# Patient Record
Sex: Female | Born: 1945 | Race: White | Hispanic: No | Marital: Married | Smoking: Never smoker
Health system: Southern US, Community
[De-identification: ages and names within clinical notes are randomized; demographics above are authoritative.]

---

## 2005-01-18 ENCOUNTER — Ambulatory Visit: Payer: Self-pay | Admitting: Obstetrics and Gynecology

## 2006-12-20 ENCOUNTER — Ambulatory Visit: Payer: Self-pay | Admitting: Obstetrics and Gynecology

## 2007-01-11 ENCOUNTER — Ambulatory Visit: Payer: Self-pay | Admitting: Obstetrics and Gynecology

## 2008-02-20 ENCOUNTER — Ambulatory Visit: Payer: Self-pay | Admitting: Obstetrics and Gynecology

## 2008-02-27 ENCOUNTER — Ambulatory Visit: Payer: Self-pay | Admitting: Obstetrics and Gynecology

## 2008-03-27 ENCOUNTER — Ambulatory Visit: Payer: Self-pay | Admitting: Internal Medicine

## 2009-02-24 ENCOUNTER — Ambulatory Visit: Payer: Self-pay | Admitting: Obstetrics and Gynecology

## 2010-02-17 ENCOUNTER — Ambulatory Visit: Payer: Self-pay

## 2011-02-22 ENCOUNTER — Ambulatory Visit: Payer: Self-pay

## 2012-03-08 ENCOUNTER — Ambulatory Visit: Payer: Self-pay

## 2013-03-27 ENCOUNTER — Ambulatory Visit: Payer: Self-pay

## 2013-04-08 ENCOUNTER — Ambulatory Visit: Payer: Self-pay

## 2016-04-20 DIAGNOSIS — K219 Gastro-esophageal reflux disease without esophagitis: Secondary | ICD-10-CM | POA: Diagnosis not present

## 2016-04-20 DIAGNOSIS — Z1239 Encounter for other screening for malignant neoplasm of breast: Secondary | ICD-10-CM | POA: Diagnosis not present

## 2016-04-20 DIAGNOSIS — J329 Chronic sinusitis, unspecified: Secondary | ICD-10-CM | POA: Diagnosis not present

## 2016-04-20 DIAGNOSIS — Z0001 Encounter for general adult medical examination with abnormal findings: Secondary | ICD-10-CM | POA: Diagnosis not present

## 2016-04-20 DIAGNOSIS — M810 Age-related osteoporosis without current pathological fracture: Secondary | ICD-10-CM | POA: Diagnosis not present

## 2016-04-20 DIAGNOSIS — R3129 Other microscopic hematuria: Secondary | ICD-10-CM | POA: Diagnosis not present

## 2016-09-21 DIAGNOSIS — H40019 Open angle with borderline findings, low risk, unspecified eye: Secondary | ICD-10-CM | POA: Diagnosis not present

## 2016-09-21 DIAGNOSIS — H353131 Nonexudative age-related macular degeneration, bilateral, early dry stage: Secondary | ICD-10-CM | POA: Diagnosis not present

## 2016-09-21 DIAGNOSIS — H401130 Primary open-angle glaucoma, bilateral, stage unspecified: Secondary | ICD-10-CM | POA: Diagnosis not present

## 2016-09-21 DIAGNOSIS — H04129 Dry eye syndrome of unspecified lacrimal gland: Secondary | ICD-10-CM | POA: Diagnosis not present

## 2017-01-25 DIAGNOSIS — D239 Other benign neoplasm of skin, unspecified: Secondary | ICD-10-CM | POA: Diagnosis not present

## 2017-01-25 DIAGNOSIS — L439 Lichen planus, unspecified: Secondary | ICD-10-CM | POA: Diagnosis not present

## 2017-01-25 DIAGNOSIS — L608 Other nail disorders: Secondary | ICD-10-CM | POA: Diagnosis not present

## 2017-03-20 DIAGNOSIS — H401130 Primary open-angle glaucoma, bilateral, stage unspecified: Secondary | ICD-10-CM | POA: Diagnosis not present

## 2017-03-20 DIAGNOSIS — H04129 Dry eye syndrome of unspecified lacrimal gland: Secondary | ICD-10-CM | POA: Diagnosis not present

## 2017-03-20 DIAGNOSIS — H353131 Nonexudative age-related macular degeneration, bilateral, early dry stage: Secondary | ICD-10-CM | POA: Diagnosis not present

## 2017-04-25 ENCOUNTER — Other Ambulatory Visit: Payer: Self-pay | Admitting: Internal Medicine

## 2017-04-25 DIAGNOSIS — Z0000 Encounter for general adult medical examination without abnormal findings: Secondary | ICD-10-CM

## 2017-04-25 DIAGNOSIS — Z780 Asymptomatic menopausal state: Secondary | ICD-10-CM | POA: Diagnosis not present

## 2017-04-25 DIAGNOSIS — K219 Gastro-esophageal reflux disease without esophagitis: Secondary | ICD-10-CM | POA: Diagnosis not present

## 2017-04-25 DIAGNOSIS — R3129 Other microscopic hematuria: Secondary | ICD-10-CM | POA: Diagnosis not present

## 2017-04-25 DIAGNOSIS — R1013 Epigastric pain: Secondary | ICD-10-CM | POA: Diagnosis not present

## 2017-04-25 DIAGNOSIS — M810 Age-related osteoporosis without current pathological fracture: Secondary | ICD-10-CM | POA: Diagnosis not present

## 2017-05-01 DIAGNOSIS — R3129 Other microscopic hematuria: Secondary | ICD-10-CM | POA: Diagnosis not present

## 2017-05-01 DIAGNOSIS — Z0000 Encounter for general adult medical examination without abnormal findings: Secondary | ICD-10-CM | POA: Diagnosis not present

## 2017-05-01 DIAGNOSIS — M810 Age-related osteoporosis without current pathological fracture: Secondary | ICD-10-CM | POA: Diagnosis not present

## 2017-05-10 ENCOUNTER — Other Ambulatory Visit: Payer: Self-pay

## 2017-05-11 ENCOUNTER — Ambulatory Visit
Admission: RE | Admit: 2017-05-11 | Discharge: 2017-05-11 | Disposition: A | Discharge status: Home / Self Care | Payer: BLUE CROSS/BLUE SHIELD | Source: Ambulatory Visit | Attending: Internal Medicine | Admitting: Internal Medicine

## 2017-05-11 DIAGNOSIS — Z780 Asymptomatic menopausal state: Secondary | ICD-10-CM | POA: Diagnosis not present

## 2017-05-11 DIAGNOSIS — M810 Age-related osteoporosis without current pathological fracture: Secondary | ICD-10-CM | POA: Insufficient documentation

## 2017-05-11 DIAGNOSIS — Z0000 Encounter for general adult medical examination without abnormal findings: Secondary | ICD-10-CM

## 2018-04-30 ENCOUNTER — Other Ambulatory Visit: Payer: Self-pay | Admitting: Internal Medicine

## 2018-04-30 DIAGNOSIS — Z1231 Encounter for screening mammogram for malignant neoplasm of breast: Secondary | ICD-10-CM

## 2018-06-01 ENCOUNTER — Ambulatory Visit
Admission: RE | Admit: 2018-06-01 | Discharge: 2018-06-01 | Disposition: A | Discharge status: Home / Self Care | Payer: Medicare HMO | Source: Ambulatory Visit | Attending: Internal Medicine | Admitting: Internal Medicine

## 2018-06-01 DIAGNOSIS — Z1231 Encounter for screening mammogram for malignant neoplasm of breast: Secondary | ICD-10-CM | POA: Diagnosis not present

## 2018-10-31 DIAGNOSIS — M79605 Pain in left leg: Secondary | ICD-10-CM | POA: Diagnosis not present

## 2018-12-17 DIAGNOSIS — H02889 Meibomian gland dysfunction of unspecified eye, unspecified eyelid: Secondary | ICD-10-CM | POA: Diagnosis not present

## 2018-12-17 DIAGNOSIS — H04129 Dry eye syndrome of unspecified lacrimal gland: Secondary | ICD-10-CM | POA: Diagnosis not present

## 2018-12-17 DIAGNOSIS — H401131 Primary open-angle glaucoma, bilateral, mild stage: Secondary | ICD-10-CM | POA: Diagnosis not present

## 2018-12-17 DIAGNOSIS — H40019 Open angle with borderline findings, low risk, unspecified eye: Secondary | ICD-10-CM | POA: Diagnosis not present

## 2018-12-17 DIAGNOSIS — H353131 Nonexudative age-related macular degeneration, bilateral, early dry stage: Secondary | ICD-10-CM | POA: Diagnosis not present

## 2018-12-18 DIAGNOSIS — L439 Lichen planus, unspecified: Secondary | ICD-10-CM | POA: Diagnosis not present

## 2018-12-18 DIAGNOSIS — L608 Other nail disorders: Secondary | ICD-10-CM | POA: Diagnosis not present

## 2019-05-28 DIAGNOSIS — R3129 Other microscopic hematuria: Secondary | ICD-10-CM | POA: Diagnosis not present

## 2019-05-28 DIAGNOSIS — Z0000 Encounter for general adult medical examination without abnormal findings: Secondary | ICD-10-CM | POA: Diagnosis not present

## 2019-05-28 DIAGNOSIS — M810 Age-related osteoporosis without current pathological fracture: Secondary | ICD-10-CM | POA: Diagnosis not present

## 2019-05-28 DIAGNOSIS — E785 Hyperlipidemia, unspecified: Secondary | ICD-10-CM | POA: Diagnosis not present

## 2019-06-04 DIAGNOSIS — M810 Age-related osteoporosis without current pathological fracture: Secondary | ICD-10-CM | POA: Diagnosis not present

## 2019-06-04 DIAGNOSIS — K219 Gastro-esophageal reflux disease without esophagitis: Secondary | ICD-10-CM | POA: Diagnosis not present

## 2019-06-04 DIAGNOSIS — J329 Chronic sinusitis, unspecified: Secondary | ICD-10-CM | POA: Diagnosis not present

## 2019-06-04 DIAGNOSIS — R3129 Other microscopic hematuria: Secondary | ICD-10-CM | POA: Diagnosis not present

## 2019-06-04 DIAGNOSIS — Z0000 Encounter for general adult medical examination without abnormal findings: Secondary | ICD-10-CM | POA: Diagnosis not present

## 2019-06-04 DIAGNOSIS — E559 Vitamin D deficiency, unspecified: Secondary | ICD-10-CM | POA: Diagnosis not present

## 2019-12-16 DIAGNOSIS — H04129 Dry eye syndrome of unspecified lacrimal gland: Secondary | ICD-10-CM | POA: Diagnosis not present

## 2019-12-16 DIAGNOSIS — H40013 Open angle with borderline findings, low risk, bilateral: Secondary | ICD-10-CM | POA: Diagnosis not present

## 2019-12-16 DIAGNOSIS — H40019 Open angle with borderline findings, low risk, unspecified eye: Secondary | ICD-10-CM | POA: Diagnosis not present

## 2019-12-16 DIAGNOSIS — H02889 Meibomian gland dysfunction of unspecified eye, unspecified eyelid: Secondary | ICD-10-CM | POA: Diagnosis not present

## 2020-05-28 DIAGNOSIS — R3129 Other microscopic hematuria: Secondary | ICD-10-CM | POA: Diagnosis not present

## 2020-05-28 DIAGNOSIS — Z0000 Encounter for general adult medical examination without abnormal findings: Secondary | ICD-10-CM | POA: Diagnosis not present

## 2020-05-28 DIAGNOSIS — K219 Gastro-esophageal reflux disease without esophagitis: Secondary | ICD-10-CM | POA: Diagnosis not present

## 2020-05-28 DIAGNOSIS — E559 Vitamin D deficiency, unspecified: Secondary | ICD-10-CM | POA: Diagnosis not present

## 2020-07-09 DIAGNOSIS — L439 Lichen planus, unspecified: Secondary | ICD-10-CM | POA: Diagnosis not present

## 2020-07-09 DIAGNOSIS — L720 Epidermal cyst: Secondary | ICD-10-CM | POA: Diagnosis not present

## 2020-07-09 DIAGNOSIS — L608 Other nail disorders: Secondary | ICD-10-CM | POA: Diagnosis not present

## 2020-07-16 ENCOUNTER — Other Ambulatory Visit: Payer: Self-pay | Admitting: Internal Medicine

## 2020-07-16 DIAGNOSIS — M810 Age-related osteoporosis without current pathological fracture: Secondary | ICD-10-CM

## 2020-07-21 ENCOUNTER — Other Ambulatory Visit: Payer: Self-pay | Admitting: Internal Medicine

## 2020-08-10 DIAGNOSIS — Z1231 Encounter for screening mammogram for malignant neoplasm of breast: Secondary | ICD-10-CM | POA: Diagnosis not present

## 2020-08-10 DIAGNOSIS — Z1211 Encounter for screening for malignant neoplasm of colon: Secondary | ICD-10-CM | POA: Diagnosis not present

## 2020-08-10 DIAGNOSIS — R1013 Epigastric pain: Secondary | ICD-10-CM | POA: Diagnosis not present

## 2020-08-10 DIAGNOSIS — K219 Gastro-esophageal reflux disease without esophagitis: Secondary | ICD-10-CM | POA: Diagnosis not present

## 2020-08-10 DIAGNOSIS — M810 Age-related osteoporosis without current pathological fracture: Secondary | ICD-10-CM | POA: Diagnosis not present

## 2020-08-10 DIAGNOSIS — Z0000 Encounter for general adult medical examination without abnormal findings: Secondary | ICD-10-CM | POA: Diagnosis not present

## 2020-08-10 DIAGNOSIS — R3129 Other microscopic hematuria: Secondary | ICD-10-CM | POA: Diagnosis not present

## 2020-08-11 ENCOUNTER — Other Ambulatory Visit: Payer: Self-pay | Admitting: Internal Medicine

## 2020-08-11 DIAGNOSIS — Z1231 Encounter for screening mammogram for malignant neoplasm of breast: Secondary | ICD-10-CM

## 2020-08-20 ENCOUNTER — Other Ambulatory Visit: Payer: Self-pay

## 2020-08-20 ENCOUNTER — Ambulatory Visit
Admission: RE | Admit: 2020-08-20 | Discharge: 2020-08-20 | Disposition: A | Discharge status: Home / Self Care | Payer: Medicare HMO | Source: Ambulatory Visit | Attending: Internal Medicine | Admitting: Internal Medicine

## 2020-08-20 DIAGNOSIS — M810 Age-related osteoporosis without current pathological fracture: Secondary | ICD-10-CM

## 2020-08-20 DIAGNOSIS — Z780 Asymptomatic menopausal state: Secondary | ICD-10-CM | POA: Diagnosis not present

## 2020-08-20 DIAGNOSIS — Z8739 Personal history of other diseases of the musculoskeletal system and connective tissue: Secondary | ICD-10-CM | POA: Diagnosis not present

## 2020-11-03 ENCOUNTER — Other Ambulatory Visit: Payer: Self-pay

## 2020-11-03 ENCOUNTER — Ambulatory Visit
Admission: RE | Admit: 2020-11-03 | Discharge: 2020-11-03 | Disposition: A | Discharge status: Home / Self Care | Payer: Medicare HMO | Source: Ambulatory Visit | Attending: Internal Medicine | Admitting: Internal Medicine

## 2020-11-03 DIAGNOSIS — Z1231 Encounter for screening mammogram for malignant neoplasm of breast: Secondary | ICD-10-CM

## 2021-06-21 ENCOUNTER — Encounter: Payer: Self-pay | Admitting: Occupational Therapy

## 2021-06-21 ENCOUNTER — Ambulatory Visit: Payer: Medicare HMO | Attending: Orthopedic Surgery | Admitting: Occupational Therapy

## 2021-06-21 DIAGNOSIS — M25642 Stiffness of left hand, not elsewhere classified: Secondary | ICD-10-CM | POA: Insufficient documentation

## 2021-06-21 DIAGNOSIS — M20019 Mallet finger of unspecified finger(s): Secondary | ICD-10-CM | POA: Diagnosis not present

## 2021-06-21 DIAGNOSIS — M6281 Muscle weakness (generalized): Secondary | ICD-10-CM | POA: Diagnosis present

## 2021-06-21 NOTE — Therapy (Signed)
[facility] [address] [place], [address] Phone: [phone]   Fax:  [phone]  Occupational Therapy Evaluation  Patient Details  Name: Daisy Welch MRN: [id number] Date of Birth: 1945/10/23 Referring Provider (OT): Tamala Julian PA   Encounter Date: 06/21/2021   OT End of Session - 06/21/21 2233     Visit Number 1    Number of Visits 12    Date for OT Re-Evaluation 09/13/21    OT Start Time 1038    OT Stop Time 1120    OT Time Calculation (min) 42 min    Activity Tolerance Patient tolerated treatment well    Behavior During Therapy [facility] for tasks assessed/performed             History reviewed. No pertinent past medical history.  History reviewed. No pertinent surgical history.  There were no vitals filed for this visit.   Subjective Assessment - 06/21/21 2228     Pertinent History Daisy Welch is a 75 y.o. female that 9 1/2 weeks after sustaining a left index finger mallet finger. Patient was initially seen on 04/15/21 by ortho and diagnosed with a mallet finger. She underwent 6 weeks of splinting and was seen on 05/27/21 at which time she had a slight extension lag but was improved from her initial presentation.  was put in splint again for 2 wks - and then seen and refer to OT about 1 1/2 wks ago - She presents today without any splints on  Denies any numbness, tingling in the finger.    Patient Stated Goals I want my finger better- so I can use it - it looks like it did when I injured it - work on computer , do buttons, pick up small objects    Currently in Pain? No/denies               [facility] OT Assessment - 06/21/21 0001       Assessment   Medical Diagnosis R 2nd digit mallet finger    Referring Provider (OT) Tamala Julian PA    Onset Date/Surgical Date 04/15/21    Hand Dominance Right      Home  Environment   Lives With Spouse      Prior Function   Vocation Full time  employment    Leisure from home on computer [employer]), read, light cooking , laundery      Right Hand AROM   R Index  MCP 0-90 90 Degrees    R Index PIP 0-100 90 Degrees    R Index DIP 0-70 70 Degrees   able to place and hold in some extention for few sec - but then drop into flexion            Fabricated gutter splint with DIP in hyper extention and include at this time PIP to- will change splint to allow later PIP flexion - but pt to wear all the time  Ed on donning ,doffing and wearing correctly to maintain full extention when doing skin care  To wear for week and check again  Pt arrive with no splint and 70 degrees of flexion at DIP                   OT Education - 06/21/21 2233     Education Details findings and HEP /splint wearing    Person(s) Educated Patient    Methods Explanation;Demonstration;Tactile cues;Verbal cues    Comprehension  Verbal cues required;Returned demonstration;Verbalized understanding                 OT Long Term Goals - 06/21/21 2241       OT LONG TERM GOAL #1   Title Pt to be independent in wearing, donning and doffing of mallet splint to maintain full extention with out skin issues or irritation    Baseline DIP flex at 70 , had some issues with skin doing splint first 6 wks at MD using tape -    Time 6    Period Weeks    Status New    Target Date 08/02/21      OT LONG TERM GOAL #2   Title Pt to show increase extention of L 2nd digit DIP to maintain for 30 min extention with ext lag less than 30    Baseline DIP of 2nd digit this date coming and 70    Time 8    Period Weeks    Status New    Target Date 08/16/21      OT LONG TERM GOAL #3   Title Pt able to tolerate AROM for flexion of 2nd digit to touch palm while maintaining extention lag less than 30 to initiate strengthening    Baseline DIP flexion at 70 , cannot extend active - immobilize again for 6 wks    Time 12    Period Weeks    Status New    Target Date  09/13/21                   Plan - 06/21/21 2234     Clinical Impression Statement Pt present at OT eval 9 1/2 wks out from R 2nd digit Mallet finger - pt was splinted for 6 wks and then in another splint for 2 wks - refer to OT on 06/10/21 for increase stiffness in R 2nd digit - pt arrive this date with no splint on - R 2nd digit DIP at 70 flexion, MC and PIP each at 90 flexion -full ext- pt unable to actively ext DIP -but place and hold to keep partial ext for few seconds. Pt do show some edema in 2nd digit but no pain. Explain for pt that flexion no issue - will return fast but extention priority- and that needs to wean gradually our of splint and ONLY AROM gradually , NO PROM - pt was fabricated a DIP and PIP exention splint to wear for week - with DIP in slight hyper extention -pt ed on donning and doffing correctly to maintain extention while doing hygiene and skin care - will follow up again with pt in week - ? if to far out from injury or can regain DIP extention with longer immobilization and then gradually weaning out to maintain progress from splinting - cont to monitor pt progress - pt limited in use of R 2nd digit for computer use, picking up small objects , doing buttons and pushing buttons    OT Occupational Profile and History Problem Focused Assessment - Including review of records relating to presenting problem    Occupational performance deficits (Please refer to evaluation for details): ADL's;IADL's;Work;Play;Leisure    Rehab Potential Fair    Clinical Decision Making Limited treatment options, no task modification necessary    Comorbidities Affecting Occupational Performance: None    Modification or Assistance to Complete Evaluation  No modification of tasks or assist necessary to complete eval    OT Frequency 1x / week  OT Duration 12 weeks    OT Treatment/Interventions Self-care/ADL training;Manual Therapy;Therapeutic exercise;Contrast Bath;Fluidtherapy;DME and/or AE  instruction;Splinting    Consulted and Agree with Plan of Care Patient             Patient will benefit from skilled therapeutic intervention in order to improve the following deficits and impairments:           Visit Diagnosis: Mallet deformity of index finger - Plan: Ot plan of care cert/re-cert  Stiffness of left hand, not elsewhere classified - Plan: Ot plan of care cert/re-cert  Muscle weakness (generalized) - Plan: Ot plan of care cert/re-cert    Problem List There are no problems to display for this patient.   Rosalyn Gess, OTR/L,CLT 06/21/2021, 10:47 PM  [facility] [address] [place], [address] Phone: [phone]   Fax:  [phone]  Name: Daisy Welch MRN: [id number] Date of Birth: [birth year]

## 2021-06-28 ENCOUNTER — Ambulatory Visit: Payer: Medicare HMO | Admitting: Occupational Therapy

## 2021-06-28 DIAGNOSIS — M20019 Mallet finger of unspecified finger(s): Secondary | ICD-10-CM

## 2021-06-28 DIAGNOSIS — M25642 Stiffness of left hand, not elsewhere classified: Secondary | ICD-10-CM

## 2021-06-28 DIAGNOSIS — M6281 Muscle weakness (generalized): Secondary | ICD-10-CM

## 2021-06-28 NOTE — Therapy (Signed)
[facility] [address] [place], [address] Phone: [phone]   Fax:  [phone]  Occupational Therapy Treatment  Patient Details  Name: Daisy Welch MRN: [id number] Date of Birth: 10/16/1945 Referring Provider (OT): Tamala Julian PA   Encounter Date: 06/28/2021   OT End of Session - 06/28/21 1230     Visit Number 2    Number of Visits 12    Date for OT Re-Evaluation 09/13/21    OT Start Time 0820    OT Stop Time 0900    OT Time Calculation (min) 40 min    Activity Tolerance Patient tolerated treatment well    Behavior During Therapy [facility] for tasks assessed/performed             No past medical history on file.  No past surgical history on file.  There were no vitals filed for this visit.   Subjective Assessment - 06/28/21 1227     Subjective  I wore the splint -but it hurts my finger - and do you know if they can do surgery - I cannot use my finger the way it is    Pertinent History Daisy Welch is a 75 y.o. female that 9 1/2 weeks after sustaining a left index finger mallet finger. Patient was initially seen on 04/15/21 by ortho and diagnosed with a mallet finger. She underwent 6 weeks of splinting and was seen on 05/27/21 at which time she had a slight extension lag but was improved from her initial presentation.  was put in splint again for 2 wks - and then seen and refer to OT about 1 1/2 wks ago - She presents today without any splints on  Denies any numbness, tingling in the finger.    Patient Stated Goals I want my finger better- so I can use it - it looks like it did when I injured it - work on computer , do buttons, pick up small objects    Currently in Pain? Yes    Pain Score 3     Pain Location Finger (Comment which one)    Pain Orientation Left    Pain Descriptors / Indicators Aching;Tender    Pain Type Acute pain                          OT Treatments/Exercises  (OP) - 06/28/21 0001       LUE Contrast Bath   Time 8 minutes    Comments hand prior to splinting               Fabricated new mallet finger  splint with DIP in hyper extention and -  but pt to wear all the time - take off and rest digits on table 5 x day for skin integrity Ed on donning ,doffing and wearing correctly to maintain full extention when doing skin care - and let it air To wear and get in with hand surgeon for consultation - ask about any hand surgeons in the area that specialize in hands - provided list from several practices  Pt ask about anything else that can be done - cannot use on computer , or buttons, pick up small objects and it hurts  Pt arrived last week with no splint and 70 degrees of flexion at DIP  DIP still flexed - and no progress - and do not want to splint pt conservatory again - was done  before        OT Education - 06/28/21 1229     Education Details splint wearing and refer for assessment with surgeon    Person(s) Educated Patient    Methods Explanation;Demonstration;Tactile cues;Verbal cues    Comprehension Verbal cues required;Returned demonstration;Verbalized understanding                 OT Long Term Goals - 06/21/21 2241       OT LONG TERM GOAL #1   Title Pt to be independent in wearing, donning and doffing of mallet splint to maintain full extention with out skin issues or irritation    Baseline DIP flex at 70 , had some issues with skin doing splint first 6 wks at MD using tape -    Time 6    Period Weeks    Status New    Target Date 08/02/21      OT LONG TERM GOAL #2   Title Pt to show increase extention of L 2nd digit DIP to maintain for 30 min extention with ext lag less than 30    Baseline DIP of 2nd digit this date coming and 70    Time 8    Period Weeks    Status New    Target Date 08/16/21      OT LONG TERM GOAL #3   Title Pt able to tolerate AROM for flexion of 2nd digit to touch palm while maintaining  extention lag less than 30 to initiate strengthening    Baseline DIP flexion at 70 , cannot extend active - immobilize again for 6 wks    Time 12    Period Weeks    Status New    Target Date 09/13/21                   Plan - 06/28/21 1230     Clinical Impression Statement Pt present at OT eval last week  9 1/2 wks out from R 2nd digit Mallet finger - pt was splinted for 6 wks and then in another splint for 2 wks - refer to OT on 06/10/21 for increase stiffness in R 2nd digit - pt arrive last week with no splint on - R 2nd digit DIP at 70 flexion, MC and PIP each at 90 flexion - pt unable to actively ext DIP -but place and hold to keep partial ext for few seconds. Pt do show some edema in 2nd digit but no pain. Explain for pt that flexion no issue - will return fast but extention priority- and that needs to wean gradually out of splint and ONLY AROM gradually , NO PROM - pt was fabricated a DIP and PIP exention splint to wear for week - with DIP in slight hyper extention -pt arrive with this week with cont flexion of IP of 2nd digit- pt ask about hand surgeon - pt provided with some names to get in with for consultation - pt to cont with mallet finger splint until appt with Ortho - rest finger on table out of splint few times during day for skin intergrity - pt ed again on donning and doffing correctly to maintain extention while doing hygiene and skin care -  pt limited in use of R 2nd digit for computer use, picking up small objects , doing buttons and pushing buttons    OT Occupational Profile and History Problem Focused Assessment - Including review of records relating to presenting problem    Occupational performance  deficits (Please refer to evaluation for details): ADL's;IADL's;Work;Play;Leisure    Body Structure / Function / Physical Skills ADL;Pain;Dexterity;Edema;UE functional use;Flexibility    Rehab Potential Fair    Clinical Decision Making Limited treatment options, no task  modification necessary    Comorbidities Affecting Occupational Performance: None    Modification or Assistance to Complete Evaluation  No modification of tasks or assist necessary to complete eval    OT Frequency 1x / week    OT Duration 12 weeks    OT Treatment/Interventions Self-care/ADL training;Manual Therapy;Therapeutic exercise;Contrast Bath;Fluidtherapy;DME and/or AE instruction;Splinting    Consulted and Agree with Plan of Care Patient             Patient will benefit from skilled therapeutic intervention in order to improve the following deficits and impairments:   Body Structure / Function / Physical Skills: ADL, Pain, Dexterity, Edema, UE functional use, Flexibility       Visit Diagnosis: Mallet deformity of index finger  Stiffness of left hand, not elsewhere classified  Muscle weakness (generalized)    Problem List There are no problems to display for this patient.   Daisy Welch, OTR/L,CLT 06/28/2021, 12:35 PM  [facility] [address], [place], [address] Phone: [phone]   Fax:  [phone]  Name: Daisy Welch MRN: [id number] Date of Birth: 04/08/1946

## 2021-07-06 ENCOUNTER — Ambulatory Visit: Payer: Medicare HMO | Attending: Orthopedic Surgery | Admitting: Occupational Therapy

## 2021-07-06 DIAGNOSIS — M25642 Stiffness of left hand, not elsewhere classified: Secondary | ICD-10-CM | POA: Insufficient documentation

## 2021-07-06 DIAGNOSIS — M6281 Muscle weakness (generalized): Secondary | ICD-10-CM | POA: Diagnosis present

## 2021-07-06 DIAGNOSIS — M20019 Mallet finger of unspecified finger(s): Secondary | ICD-10-CM | POA: Diagnosis present

## 2021-07-06 NOTE — Therapy (Signed)
[facility] [address], [place], [address] Phone: [phone]   Fax:  [phone]  Occupational Therapy Treatment  Patient Details  Name: Daisy Welch MRN: [id number] Date of Birth: 02-17-1946 Referring Provider (OT): Tamala Julian PA   Encounter Date: 07/06/2021   OT End of Session - 07/06/21 1138     Visit Number 3    Number of Visits 12    Date for OT Re-Evaluation 09/13/21    OT Start Time 0818    OT Stop Time 0900    OT Time Calculation (min) 42 min    Activity Tolerance Patient tolerated treatment well    Behavior During Therapy [facility] for tasks assessed/performed             No past medical history on file.  No past surgical history on file.  There were no vitals filed for this visit.   Subjective Assessment - 07/06/21 1135     Subjective  Since last weeks end my skin irritated from wearing splint - so I took it off some - I have appt  with surgeon for consult end of Oct - but can I go without splint - hurts at times -    Pertinent History Daisy Welch is a 75 y.o. female that 9 1/2 weeks after sustaining a left index finger mallet finger. Patient was initially seen on 04/15/21 by ortho and diagnosed with a mallet finger. She underwent 6 weeks of splinting and was seen on 05/27/21 at which time she had a slight extension lag but was improved from her initial presentation.  was put in splint again for 2 wks - and then seen and refer to OT about 1 1/2 wks ago - She presents today without any splints on  Denies any numbness, tingling in the finger.    Patient Stated Goals I want my finger better- so I can use it - it looks like it did when I injured it - work on computer , do buttons, pick up small objects    Currently in Pain? Yes    Pain Score 2     Pain Location Finger (Comment which one)    Pain Orientation Left    Pain Descriptors / Indicators Aching;Tender    Pain Type Acute pain     Pain Onset More than a month ago    Pain Frequency Intermittent                [facility] OT Assessment - 07/06/21 0001       Right Hand AROM   R Index  MCP 0-90 98 Degrees    R Index PIP 0-100 80 Degrees    R Index DIP 0-70 70 Degrees                    Fabricated new mallet finger  splint with DIP  in extention - 3 rd one since eval - pt cannot tolerated prior ones - had skin issues since start  Done this date gutter splint - with some mold skin  Pt asking about living with finger like it is  She had splint off 2wks prior to OT eval - after she was splinted for 6-8 wks with ortho  Did recommend she gets in with hand surgeon for consultation -  for what options there are except living with finger like it is   Pt ask about anything else that can be done - cannot  use on computer , or buttons, pick up small objects and it hurts  Pt arrived at eval 2 wks ago with no splint and 70 degrees of flexion at DIP  DIP still flexed - and no progress - and do not want to splint pt conservatory again  because of skin issues - was done before Pt to try and wean gradually over the new 3 wks out of splint -hour at time for few days - few times  Then 2 hrs - and then 3 hrs  But if any issues - keep splint on until surgeon appt end of Oct per pt             OT Education - 07/06/21 1138     Education Details splint wearing and refer for consult  with surgeon    Person(s) Educated Patient    Methods Explanation;Demonstration;Tactile cues;Verbal cues    Comprehension Verbal cues required;Returned demonstration;Verbalized understanding                 OT Long Term Goals - 06/21/21 2241       OT LONG TERM GOAL #1   Title Pt to be independent in wearing, donning and doffing of mallet splint to maintain full extention with out skin issues or irritation    Baseline DIP flex at 70 , had some issues with skin doing splint first 6 wks at MD using tape -    Time 6    Period  Weeks    Status New    Target Date 08/02/21      OT LONG TERM GOAL #2   Title Pt to show increase extention of L 2nd digit DIP to maintain for 30 min extention with ext lag less than 30    Baseline DIP of 2nd digit this date coming and 70    Time 8    Period Weeks    Status New    Target Date 08/16/21      OT LONG TERM GOAL #3   Title Pt able to tolerate AROM for flexion of 2nd digit to touch palm while maintaining extention lag less than 30 to initiate strengthening    Baseline DIP flexion at 70 , cannot extend active - immobilize again for 6 wks    Time 12    Period Weeks    Status New    Target Date 09/13/21                   Plan - 07/06/21 1138     Clinical Impression Statement Pt present at OT eval 2 weeks ago   9 1/2 wks out from R 2nd digit Mallet finger - pt was splinted for 6 wks and then in another splint for 2 wks - refer to OT on 06/10/21 for increase stiffness in R 2nd digit - pt arrive 2 wks ago with no splint on - R 2nd digit DIP at 70 flexion, MC and PIP each at 90 flexion - pt unable to actively ext DIP -but place and hold to keep partial ext for few seconds. Explained for pt that flexion no issue - will return fast but extention priority- and that needs to wean gradually out of splint and ONLY AROM gradually , NO PROM - pt was fabricated a DIP and PIP exention splint and then DIP extention splint for the first 2 visits but skin cannot tolerate it - and phoned couple of times about not wearing splint , or just want to  live with it like it is - but has some pain and edema -  pt arrive this date - with appt not until end of month with surgeon for consult - pt advice that she can try and wean out of splint gradually as I would have done if she was treated conservatively successfully -but - but the 2 wks prior to eval she did not wear splint - BUT she can try and wean gradually over the next 3 wks until appt with surgeon - if not able to progress, increase pain , edema or  issues - can keep splint on or wean until appt with surgeon - to hear what other options there are - -  pt limited in use of R 2nd digit for computer use, picking up small objects , doing buttons and pushing buttons - pt to see surgeon end of Oct    OT Occupational Profile and History Problem Focused Assessment - Including review of records relating to presenting problem    Occupational performance deficits (Please refer to evaluation for details): ADL's;IADL's;Work;Play;Leisure    Body Structure / Function / Physical Skills ADL;Pain;Dexterity;Edema;UE functional use;Flexibility    Rehab Potential Fair    Clinical Decision Making Limited treatment options, no task modification necessary    Comorbidities Affecting Occupational Performance: None    Modification or Assistance to Complete Evaluation  No modification of tasks or assist necessary to complete eval    OT Frequency 1x / week    OT Duration 12 weeks    OT Treatment/Interventions Self-care/ADL training;Manual Therapy;Therapeutic exercise;Contrast Bath;Fluidtherapy;DME and/or AE instruction;Splinting    Consulted and Agree with Plan of Care Patient             Patient will benefit from skilled therapeutic intervention in order to improve the following deficits and impairments:   Body Structure / Function / Physical Skills: ADL, Pain, Dexterity, Edema, UE functional use, Flexibility       Visit Diagnosis: Mallet deformity of index finger  Stiffness of left hand, not elsewhere classified  Muscle weakness (generalized)    Problem List There are no problems to display for this patient.   Rosalyn Gess, OTR/L,CLT 07/06/2021, 11:48 AM  [facility] [address], [place], [address] Phone: [phone]   Fax:  [phone]  Name: Daisy Welch MRN: [id number] Date of Birth: 09/01/1946

## 2021-07-27 DIAGNOSIS — M20012 Mallet finger of left finger(s): Secondary | ICD-10-CM | POA: Insufficient documentation

## 2021-07-27 DIAGNOSIS — M79645 Pain in left finger(s): Secondary | ICD-10-CM | POA: Insufficient documentation

## 2021-10-20 ENCOUNTER — Other Ambulatory Visit: Payer: Self-pay

## 2021-10-20 ENCOUNTER — Encounter: Payer: Self-pay | Admitting: Podiatry

## 2021-10-20 ENCOUNTER — Ambulatory Visit: Payer: Medicare HMO | Admitting: Podiatry

## 2021-10-20 DIAGNOSIS — J329 Chronic sinusitis, unspecified: Secondary | ICD-10-CM | POA: Insufficient documentation

## 2021-10-20 DIAGNOSIS — R1013 Epigastric pain: Secondary | ICD-10-CM | POA: Insufficient documentation

## 2021-10-20 DIAGNOSIS — L03032 Cellulitis of left toe: Secondary | ICD-10-CM

## 2021-10-20 DIAGNOSIS — K219 Gastro-esophageal reflux disease without esophagitis: Secondary | ICD-10-CM | POA: Insufficient documentation

## 2021-10-20 DIAGNOSIS — L603 Nail dystrophy: Secondary | ICD-10-CM | POA: Diagnosis not present

## 2021-10-20 DIAGNOSIS — M810 Age-related osteoporosis without current pathological fracture: Secondary | ICD-10-CM | POA: Insufficient documentation

## 2021-10-20 DIAGNOSIS — R3129 Other microscopic hematuria: Secondary | ICD-10-CM | POA: Insufficient documentation

## 2021-10-20 MED ORDER — NEOMYCIN-POLYMYXIN-HC 1 % OT SOLN: OTIC | 1 refills | Status: AC

## 2021-10-20 NOTE — Progress Notes (Signed)
Subjective:  Patient ID: Daisy Welch, female    DOB: 1945-10-20,  MRN: [id number] HPI Chief Complaint  Patient presents with   Toe Pain    Hallux left - thick, discolored x few months, started to become sore last week, using vaseline around nail   New Patient (Initial Visit)    76 y.o. female presents with the above complaint.   ROS: Denies fever chills nausea vomiting muscle aches pains calf pain back pain chest pain shortness of breath.  No past medical history on file. No past surgical history on file.  Current Outpatient Medications:    NEOMYCIN-POLYMYXIN-HYDROCORTISONE (CORTISPORIN) 1 % SOLN OTIC solution, Apply 1-2 drops to toe BID after soaking, Disp: 10 mL, Rfl: 1   ALPRAZolam (XANAX) 1 MG tablet, Take 0.5 mg by mouth 3 (three) times daily as needed., Disp: , Rfl:    estradiol (ESTRACE) 0.5 MG tablet, Take 0.5 mg by mouth daily., Disp: , Rfl:    RESTASIS 0.05 % ophthalmic emulsion, 1 drop 2 (two) times daily., Disp: , Rfl:   Allergies  Allergen Reactions   Penicillins Anaphylaxis and Swelling   [place] Clam (M. [place]en[place]) Skin Test     Other reaction(s): Facial Swelling   Clarithromycin     Other reaction(s): Other (See Comments) thrush   Codeine Nausea Only    Other reaction(s): Vomiting   Eszopiclone     Other reaction(s): Other (See Comments) Increased reflux symptoms   Other Nausea Only    Other reaction(s): Vomiting Clams MSG-caused throat swelling/trouble breathing   Review of Systems Objective:  There were no vitals filed for this visit.  General: Well developed, nourished, in no acute distress, alert and oriented x3   Dermatological: Skin is warm, dry and supple bilateral. Nails x 10 are well maintained; remaining integument appears unremarkable at this time. There are no open sores, no preulcerative lesions, no rash or signs of infection present.  Hallux left does demonstrate subungual fluid I trimmed the nail back today as the nail is  thick yellow and dystrophic in appearance there was some subungual fluid that was present so more than likely this is a subungual seroma or abscess.  There was no bloody drainage associated with it.  I did send the samples for pathologic evaluation.    Vascular: Dorsalis Pedis artery and Posterior Tibial artery pedal pulses are 2/4 bilateral with immedate capillary fill time. Pedal hair growth present. No varicosities and no lower extremity edema present bilateral.   Neruologic: Grossly intact via light touch bilateral. Vibratory intact via tuning fork bilateral. Protective threshold with Semmes Wienstein monofilament intact to all pedal sites bilateral. Patellar and Achilles deep tendon reflexes 2+ bilateral. No Babinski or clonus noted bilateral.   Musculoskeletal: No gross boney pedal deformities bilateral. No pain, crepitus, or limitation noted with foot and ankle range of motion bilateral. Muscular strength 5/5 in all groups tested bilateral.  Gait: Unassisted, Nonantalgic.    Radiographs:  None taken   Assessment & Plan:   Assessment: Subungual abscess hallux left.  Nail dystrophy hallux left.  Plan: Increase her doxycycline that she takes for her rosacea daily to twice a day instead of once a day.  I also sent the nail samples for pathologic evaluation.  I also requested a prescription for Cortisporin Otic to be applied to the distal end of the toe after soaking in Epson salts and warm water.  Hopefully this will help dry that out underneath the nail.  I will follow-up with her  in about 1 month from the samples are ready.     Kimi Bordeau T. [place], [place]

## 2021-11-17 ENCOUNTER — Other Ambulatory Visit: Payer: Self-pay

## 2021-11-17 ENCOUNTER — Ambulatory Visit: Payer: Medicare HMO | Admitting: Podiatry

## 2021-11-17 ENCOUNTER — Encounter: Payer: Self-pay | Admitting: Podiatry

## 2021-11-17 DIAGNOSIS — L03032 Cellulitis of left toe: Secondary | ICD-10-CM | POA: Diagnosis not present

## 2021-11-17 DIAGNOSIS — L603 Nail dystrophy: Secondary | ICD-10-CM

## 2021-11-17 NOTE — Progress Notes (Signed)
She presents today for follow-up of her pathology results.  States that she has been putting the drops under the nail but the nail really has not changed colors.  Objective: Vital signs are stable alert oriented x3.  The nail is very loose there is still some drainage from beneath the nail.  I had trim the nail back today drastically and resected the granuloma from the nailbed.  She tolerated that well and will place a Band-Aid dressing with some Neosporin.  I encouraged her to soak this in Epsom salts and warm water and continue the use of the Cortisporin otic.  Assessment: Subungual abscess negative pathology findings.  Plan: Debridement of the nail today application of Neosporin with a Band-Aid encouraged her to soak Epsom salts and warm water and continue the Band-Aid during the day with some mild amount of Neosporin.

## 2022-06-28 IMAGING — MG MM DIGITAL SCREENING BILAT W/ TOMO AND CAD
8 series · 8 of 24 positions shown · non-contrast
Comparison: Previous exam(s).

CLINICAL DATA: Screening.

EXAM:
DIGITAL SCREENING BILATERAL MAMMOGRAM WITH TOMO AND CAD

[R MLO synth-2D]
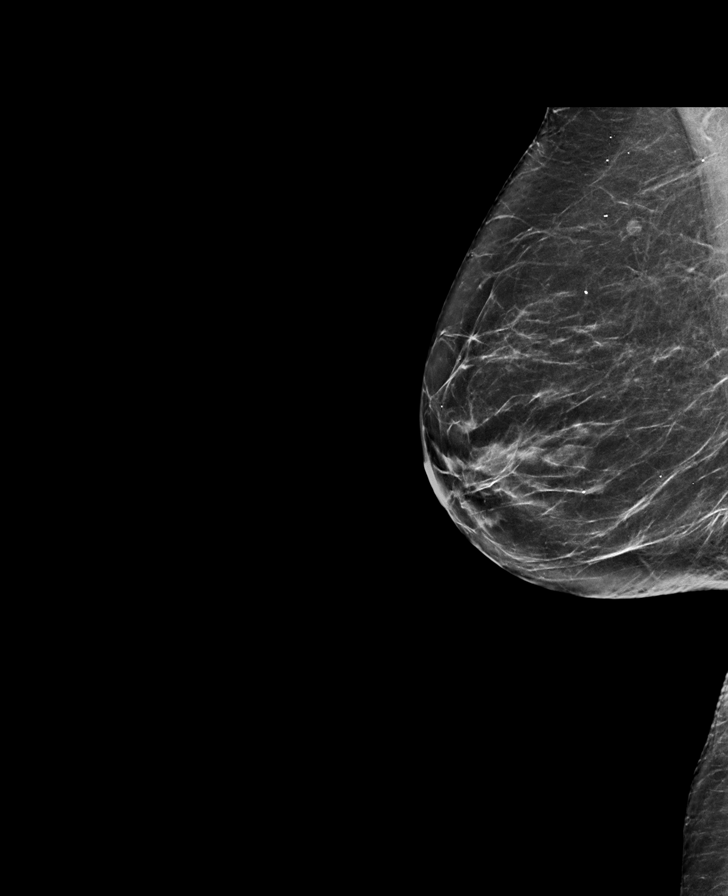

[R CC synth-2D]
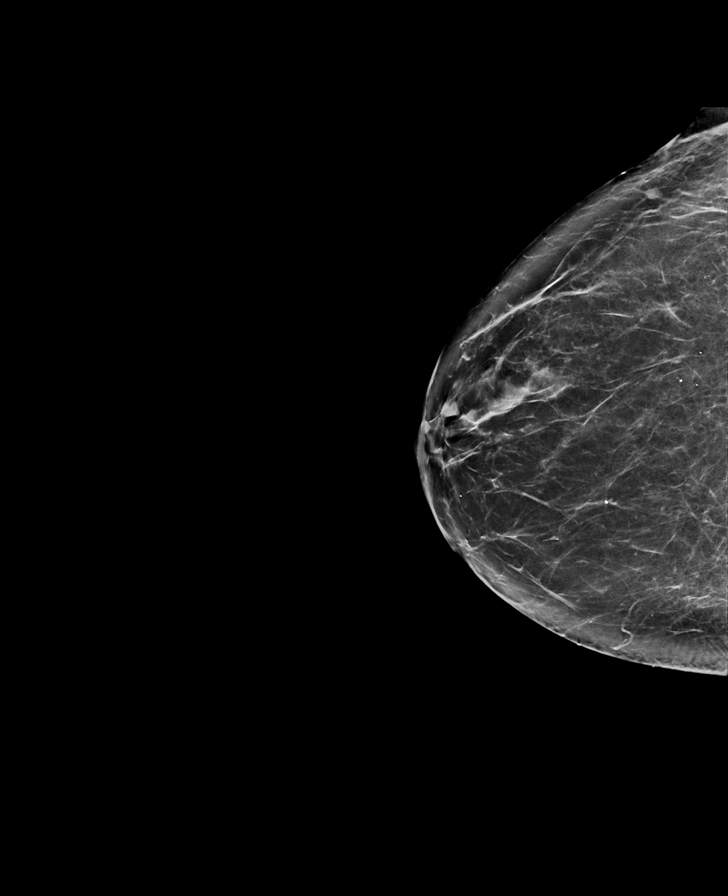

[L CC synth-2D]
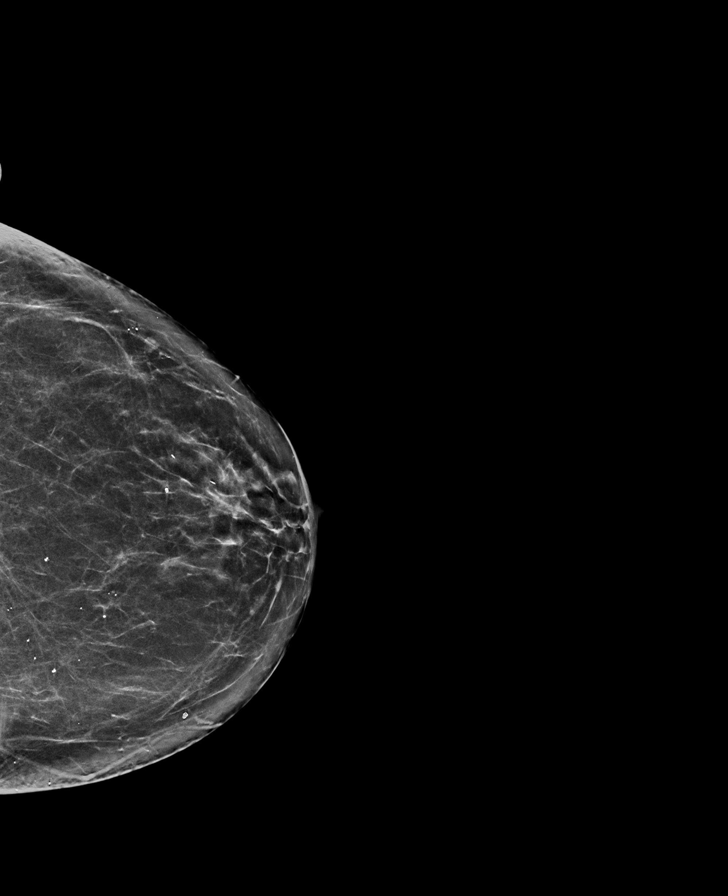

[L MLO synth-2D]
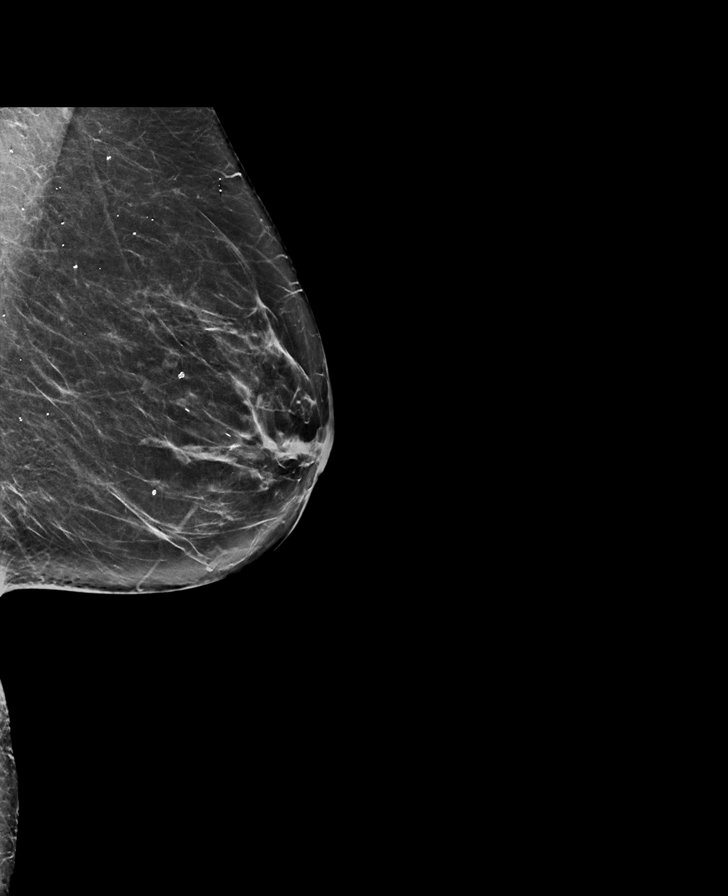

[L MLO tomo · tomo slice 36/71.0]
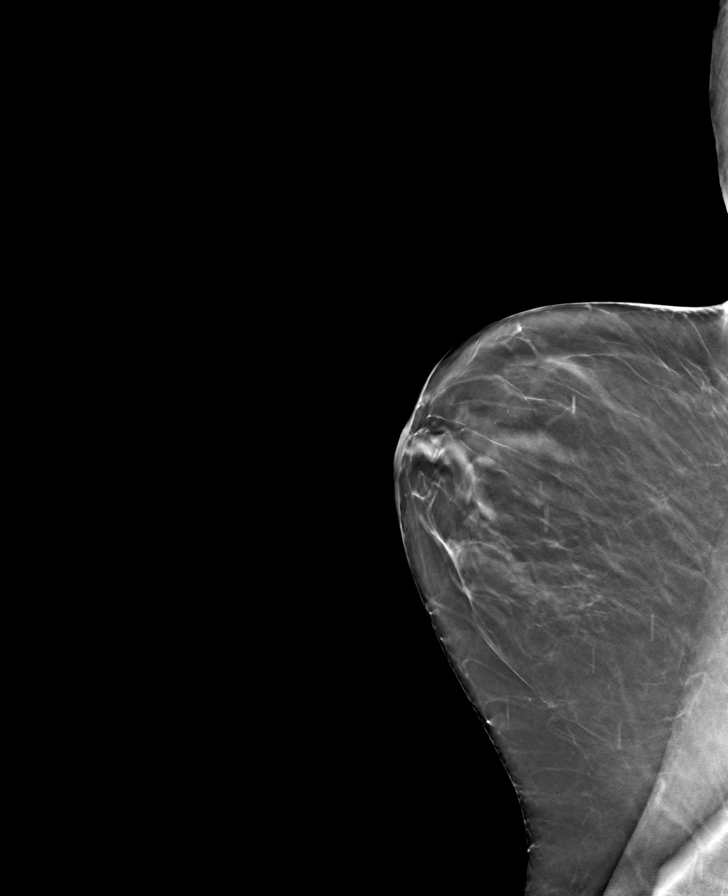

[R CC tomo · tomo slice 35/70.0]
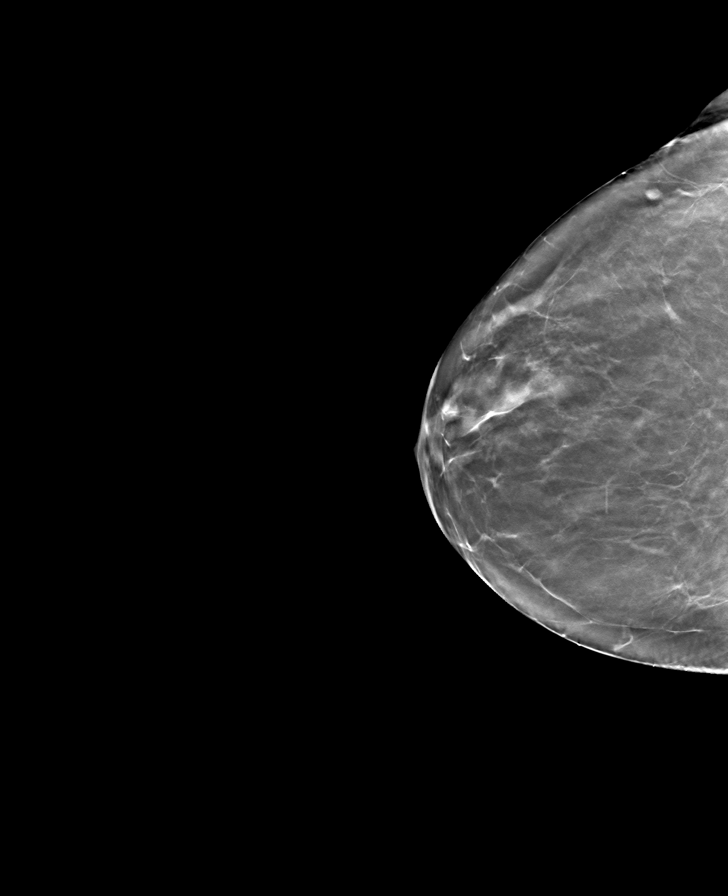

[R MLO tomo · tomo slice 34/67.0]
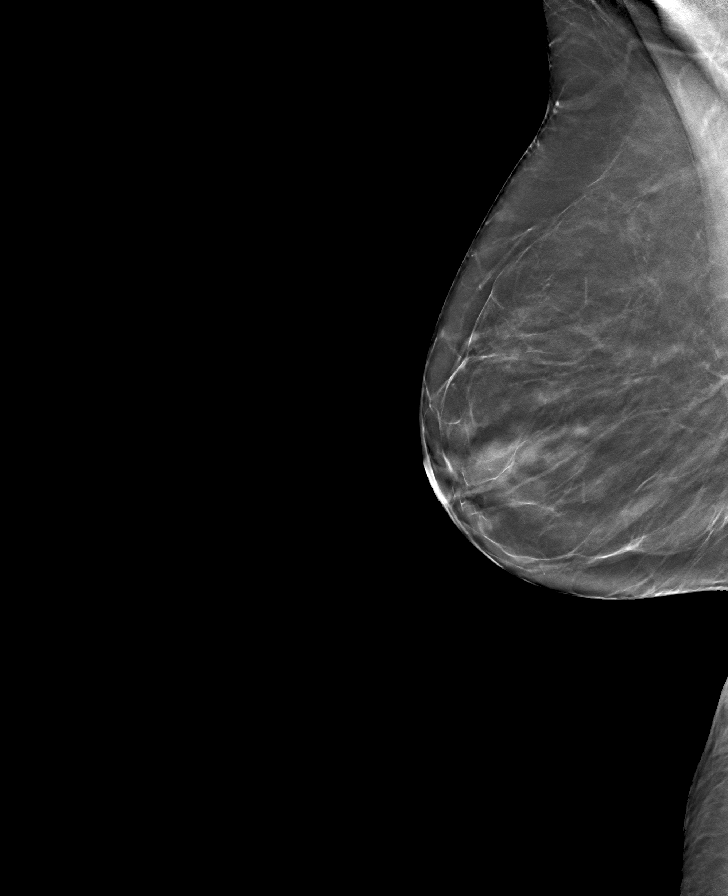

[L CC tomo · tomo slice 33/66.0]
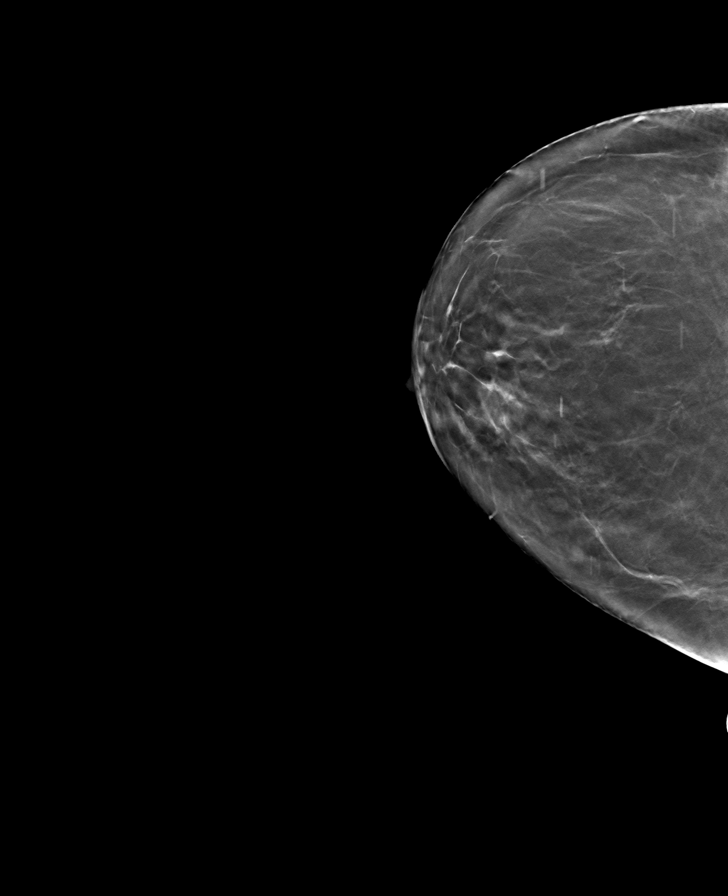

[8 of 24 positions shown; findings below may reference images not displayed]

ACR Breast Density Category b: There are scattered areas of
fibroglandular density.
FINDINGS: There are no findings suspicious for malignancy. The images were
evaluated with computer-aided detection.
IMPRESSION: No mammographic evidence of malignancy. A result letter of this
screening mammogram will be mailed directly to the patient.

RECOMMENDATION:
Screening mammogram in one year. (Code:ZP-7-VX7)

BI-RADS CATEGORY  1: Negative.

## 2022-12-13 ENCOUNTER — Other Ambulatory Visit: Payer: Self-pay | Admitting: Internal Medicine

## 2022-12-13 DIAGNOSIS — Z1231 Encounter for screening mammogram for malignant neoplasm of breast: Secondary | ICD-10-CM

## 2022-12-13 DIAGNOSIS — M810 Age-related osteoporosis without current pathological fracture: Secondary | ICD-10-CM

## 2022-12-13 DIAGNOSIS — K219 Gastro-esophageal reflux disease without esophagitis: Secondary | ICD-10-CM

## 2023-02-08 ENCOUNTER — Ambulatory Visit
Admission: RE | Admit: 2023-02-08 | Discharge: 2023-02-08 | Disposition: A | Discharge status: Home / Self Care | Payer: Medicare HMO | Source: Ambulatory Visit | Attending: Internal Medicine | Admitting: Internal Medicine

## 2023-02-08 DIAGNOSIS — Z1231 Encounter for screening mammogram for malignant neoplasm of breast: Secondary | ICD-10-CM | POA: Diagnosis present

## 2023-02-08 DIAGNOSIS — K219 Gastro-esophageal reflux disease without esophagitis: Secondary | ICD-10-CM | POA: Insufficient documentation

## 2023-02-08 DIAGNOSIS — M810 Age-related osteoporosis without current pathological fracture: Secondary | ICD-10-CM
